# Patient Record
Sex: Female | Born: 1980 | Race: White | Hispanic: No | Marital: Married | Smoking: Never smoker
Health system: Southern US, Community
[De-identification: ages and names within clinical notes are randomized; demographics above are authoritative.]

## PROBLEM LIST (undated history)

## (undated) DIAGNOSIS — Z789 Other specified health status: Secondary | ICD-10-CM

## (undated) HISTORY — PX: BLADDER SURGERY: SHX569

## (undated) HISTORY — PX: DILATION AND CURETTAGE OF UTERUS: SHX78

---

## 2016-12-05 ENCOUNTER — Other Ambulatory Visit (HOSPITAL_COMMUNITY): Payer: Self-pay | Admitting: Nurse Practitioner

## 2016-12-05 DIAGNOSIS — O09522 Supervision of elderly multigravida, second trimester: Secondary | ICD-10-CM

## 2016-12-05 DIAGNOSIS — Z3689 Encounter for other specified antenatal screening: Secondary | ICD-10-CM

## 2016-12-05 DIAGNOSIS — O280 Abnormal hematological finding on antenatal screening of mother: Secondary | ICD-10-CM

## 2016-12-20 ENCOUNTER — Encounter (HOSPITAL_COMMUNITY): Payer: Self-pay | Admitting: *Deleted

## 2016-12-24 ENCOUNTER — Encounter (HOSPITAL_COMMUNITY): Payer: Self-pay

## 2016-12-24 ENCOUNTER — Ambulatory Visit (HOSPITAL_COMMUNITY)
Admission: RE | Admit: 2016-12-24 | Discharge: 2016-12-24 | Disposition: A | Discharge status: Home / Self Care | Payer: Medicaid Other | Source: Ambulatory Visit | Attending: Nurse Practitioner | Admitting: Nurse Practitioner

## 2016-12-24 DIAGNOSIS — Z3A19 19 weeks gestation of pregnancy: Secondary | ICD-10-CM | POA: Insufficient documentation

## 2016-12-24 DIAGNOSIS — O280 Abnormal hematological finding on antenatal screening of mother: Secondary | ICD-10-CM

## 2016-12-24 DIAGNOSIS — O09529 Supervision of elderly multigravida, unspecified trimester: Secondary | ICD-10-CM

## 2016-12-24 DIAGNOSIS — O09522 Supervision of elderly multigravida, second trimester: Secondary | ICD-10-CM | POA: Insufficient documentation

## 2016-12-24 DIAGNOSIS — Z3689 Encounter for other specified antenatal screening: Secondary | ICD-10-CM

## 2016-12-24 DIAGNOSIS — O34219 Maternal care for unspecified type scar from previous cesarean delivery: Secondary | ICD-10-CM | POA: Diagnosis not present

## 2016-12-24 DIAGNOSIS — O365990 Maternal care for other known or suspected poor fetal growth, unspecified trimester, not applicable or unspecified: Secondary | ICD-10-CM

## 2016-12-24 HISTORY — DX: Other specified health status: Z78.9

## 2016-12-25 ENCOUNTER — Other Ambulatory Visit (HOSPITAL_COMMUNITY): Payer: Self-pay | Admitting: *Deleted

## 2016-12-25 DIAGNOSIS — O365920 Maternal care for other known or suspected poor fetal growth, second trimester, not applicable or unspecified: Secondary | ICD-10-CM

## 2016-12-26 DIAGNOSIS — O365990 Maternal care for other known or suspected poor fetal growth, unspecified trimester, not applicable or unspecified: Secondary | ICD-10-CM | POA: Insufficient documentation

## 2016-12-26 DIAGNOSIS — O09529 Supervision of elderly multigravida, unspecified trimester: Secondary | ICD-10-CM | POA: Insufficient documentation

## 2016-12-26 DIAGNOSIS — O280 Abnormal hematological finding on antenatal screening of mother: Secondary | ICD-10-CM | POA: Insufficient documentation

## 2016-12-26 NOTE — Progress Notes (Signed)
Genetic Counseling  High-Risk Gestation Note  Appointment Date:  12/26/2016 Referred By: Orbie HurstErskine, Kelly B, NP Date of Birth:  05/28/1981 Partner:  Kathy Ross   Pregnancy History: [id number]: [id number] Estimated Date of Delivery: 05/20/17 Estimated Gestational Age: 1470w0d Attending: Charlsie MerlesMark Newman, MD   Ms. Kathy SimasWendy Ross and her partner, Mr. Kathy Ross, were seen for consultation for genetic counseling because of an elevated MSAFP of 2.87 MoMs based on maternal serum screening and fetal growth restriction on today's ultrasound.    In summary:  Reviewed ultrasound findings from today's exam  Fetal growth restriction, single umbilical artery  Discussed elevated MSAFP (2.87 MoM)  Reviewed possible explanations for elevation  Reviewed advanced maternal age and associated risk for fetal aneuploidy  Reviewed results of previous screening - first trimester screen within normal limits (< 1 in 10,000 risk for Down syndrome and Trisomy 18)  Discussed additional options  Ultrasound- follow-up scheduled in 3 weeks  Fetal echocardiogram - being facilitated  NIPS for fetal aneuploidy- elected to pursue today ([facility])  Amniocentesis - declined  Expanded pan-ethnic carrier screening- elected to pursue today ([facility] 175 conditions)  Discussed associations with unexplained elevated MSAFP  Reviewed family history concerns  Discussed carrier screening options - patient elected to pursue today ([facility])  CF  SMA  Hemoglobinopathies  We reviewed [name]'s maternal serum screening result, the elevation of MSAFP, and the associated 1 in 147 risk for a fetal open neural tube defect.   We reviewed open neural tube defects including: the typical multifactorial etiology and variable prognosis.  In addition, we discussed alternative explanations for an elevated MSAFP including: normal variation, twins, feto-maternal bleeding, a gestational dating error, abdominal wall defects, kidney  differences, oligohydramnios, and placental problems.  We discussed that an unexplained elevation of MSAFP is associated with an increased risk for third trimester complications including: prematurity, low birth weight, and pre-eclampsia.    Detailed ultrasound was performed today. This ultrasound revealed fetal measurements that are consistently small, consistent with intrauterine growth restriction (IUGR).  The long bones are all less than the 3%tile, without evidence of fractures or bowing.  The bone echogenicity is also normal.  The fetal head is also less than the 3%tile.  The pregnancy is dated by early ultrasound. Single umbilical artery is visualized today. Normal fetal heart structure was not able to be confirmed at the time of today's visit. Complete ultrasound results reported under separate cover.   [name] was counseled that there is considerable variability in the definition of what constitutes fetal growth restriction; however, the most commonly accepted definition is fetal/birth weight below the 10th percentile for gestational age.  We discussed that approximately 3-10% of all pregnancies have IUGR.  IUGR can result from a variety of causes, including chronic uteroplacental insufficiency, environmental exposures (drugs, alcohol, and other teratogens), congenital infections, or genetic etiologies.   Kathy SimasWendy Ross was counseled that SUA occurs in approximately 1 in every 200 pregnancies and is defined as the presence of one umbilical artery and one umbilical vein, instead of the typical three vessel cord containing two arteries and one vein.  We discussed that the finding of an isolated SUA (no other markers or anomalies visualized) is not thought to increase the risk for fetal aneuploidy. However, the literature is conflicting regarding an association between this finding and congenital heart defects.  In addition, we discussed that SUA is known to be associated with an increased risk  for fetal growth restriction.  We reviewed the options of serial  ultrasound to monitor fetal growth and a fetal echocardiogram.    A detailed medical history was obtained; [name] denies use of any illicit substances or known teratogens.  [name] does not have a history of specific maternal illnesses and denies known exposure to infections.  [name] reported no known infections during the pregnancy. Consider drawing maternal serum TORCH titers, if not previously performed. This screening was not performed at our office during today's visit.   We discussed that uteroplacental insufficiency or poor placentation is a common cause of IUGR.  When the growth restriction is due to nutritional compromise, the head growth tends to be spared, resulting in asymmetrical growth restriction.  Ultrasound showed that the fetal head circumference is also less than the 3rd percentile for gestational age.  However, [name]'s maternal serum AFP screen revealed an elevation of 2.87 MoM; given that AFP can be associated with placental problems, this screening finding increases the concern for a placental etiology.  Regarding genetic etiologies, we discussed the increased risk for specific chromosome conditions including fetal aneuploidy.  We reviewed chromosomes, nondisjunction, and the common features of trisomy 61 and triploidy.  In addition, we discussed the slight increase in risk for other chromosome aberrations including uniparental disomy (chromosomes 7 and 14), microdeletions, duplications, insertions, and translocations. They were counseled regarding maternal age and the association with risk for chromosome conditions due to nondisjunction with aging of the ova.   We reviewed chromosomes, nondisjunction, and the associated risk for fetal aneuploidy related to a maternal age of 36 y.o. at [redacted]w[redacted]d gestation.  We specifically discussed Down syndrome (trisomy 30), trisomies 31 and 98, and sex  chromosome aneuploidies (47,XXX and 47,XXY) including the common features and prognoses of each. Ms .Kathy Ross previously had first trimester screening, which reduced the risks for fetal Down syndrome (< 1 in 10,000) and trisomy 18 (< 1 in 10,000). We reviewed the methodology of this screen and detection rates. She understands that it is not diagnostic and does not screen for all chromosome conditions.     [name] was then counseled regarding the availability of amniocentesis including the associated risks, benefits, and limitations.  She understands that chromosome analysis can be performed both prenatally (amniotic fluid) and postnatally (peripheral blood or cord blood).  Additionally, we discussed the availability of microarray analysis, which can also be performed pre and postnatally.  She was counseled that microarray analysis is a molecular based technique in which a test sample of DNA (fetal) is compared to a reference (normal) genome in order to determine if the test sample has any extra or missing genetic information.  Microarray analysis allows for the detection of genetic deletions and duplications that are 100 times smaller than those identified by routine chromosome analysis.  We discussed that recent publications show that approximately 6% of patients with an abnormal fetal ultrasound and a normal fetal karyotype had a significant microdeletion/microduplication detected by prenatal microarray analysis.    We then discussed the option of noninvasive prenatal screening (NIPS)/prenatal cell free DNA testing.  We discussed that NIPS analyzes cell free fetal DNA found in the maternal circulation and provides a pregnancy specific risk assessment for specific chromosome conditions, including: trisomy 78, trisomy 18, trisomy 64, sex chromosome aneuploidy, and select microdeletion syndrome.  This test is not diagnostic for chromosome conditions; however, the reported detection rate is greater  than 99% for most conditions and the false positive rate is less than 0.1 for all of these  conditions. Specifically we discussed the option of [facility] through [employer]. This screen assesses cell free DNA from each chromosome in maternal blood and can detect deletions or duplications that are greater than or equal to , in addition to select microdeletion syndromes ( 22q11, 15q11, 11q23, 8q24, 5p15, 4p16, and 1p36). The sensitivity of this test for gains/losses of chromosome material greater than is reported to be >95%. We discussed limitations and benefits of this screen including that it is not diagnostic and cannot assess for all chromosome conditions nor assess for single gene conditions. We discussed the possible results of these tests may include positive, negative, or no results, and that NIPS also has the potential to identify underlying maternal health conditions related to chromosome information.  After thoughtful consideration of her options, Kathy Ross elected to pursue NIPS today ([facility]) and declined amniocentesis at this time. She may consider amniocentesis pending results of screening.     We then discussed other possible explanations for the above discussed ultrasound findings including single gene conditions.  Single gene conditions are typically tested for postnatally, based on the recommendation of a medical geneticist, unless ultrasound findings or the family history are strongly suggestive of a specific syndrome. Based on the specific ultrasound findings, there is an increase in risk for one of a group of conditions called skeletal dysplasias. Skeletal dysplasias vary in severity from mild to severe/lethal conditions.  We discussed the importance of follow up ultrasounds as some of the features of specific skeletal dysplasias evolve over time.   We reviewed the availability of a postnatal consult with a medical geneticist to help determine whether or not there  is a specific genetic etiology for the described differences, if warranted.   She was also counseled that IUGR can be a feature in a variety of other genetic conditions. We discussed the option of carrier screening for a select panel of autosomal recessive and X-linked conditions via expanded pan-ethnic carrier screening panel. We reviewed that ACOG currently recommends that all patients be offered carrier screening for cystic fibrosis, spinal muscular atrophy and hemoglobinopathies. In addition, they were counseled that there are a variety of genetic screening laboratories that have pan-ethnic, or expanded, carrier screening panels, which evaluate carrier status for a wide range of genetic conditions. Some of these conditions are severe and actionable, but also rare; others occur more commonly, but are less severe. Some of these conditions may have growth restriction as a feature, but many of the conditions on the panel are unrelated We discussed that testing options range from screening for a single condition to panels of more than 200 autosomal or X-linked genetic conditions. We reviewed that the prevalence of each condition varies (and often varies with ethnicity). The detection rate varies with each condition and also varies in some cases with ethnicity, ranging from greater than 99% (in the case of hemoglobinopathies) to unknown. We reviewed that a negative carrier screen would thus reduce, but not eliminate the chance to be a carrier for these conditions. For some conditions included on specific pan-ethnic carrier screening panels, the pre-test carrier frequency and/or the detection rate is unknown. We reviewed that in the event that one partner is found to be a carrier for one or more conditions, carrier screening would be available to the partner for those conditions. We discussed the risks, benefits, and limitations of carrier screening with the couple. After thoughtful consideration of their options,  Kathy Ross elected to have the universal carrier screening panel through [facility].  This panel screens for carrier status of 175 hereditary disorders, including the ACOG recommended conditions. We discussed that results will be available in approximately 2 weeks.   We also discussed that can also be a normal variant of growth or the result of familial short stature.  We discussed that the prognosis and postnatal management depend on the underlying etiology of the IUGR.  Both family histories were reviewed and found to be contributory for the patient's nephew with Autism spectrum disorder/Asperger syndrome. We discussed that Autistic spectrum disorders (ASD) are among the most common neurodevelopmental disorders, with approximately 1 in 68 children meeting criteria for ASD, according to the Centers for Disease Control. Approximately 80% of individuals diagnosed are female. There is strong evidence that genetic factors play a critical role in development of ASD. There have been recent advances in identifying specific genetic causes of ASD, however, there are still many individuals for whom the etiology of the ASD is not known. The majority of individuals with ASD (70-80%) have essential autism. There is strong evidence that genetic factors play a critical role in development of ASD. Some individuals with ASDs are found to have causative differences in karyotype analysis, chromosomal microarray analysis, or single genes. These are more likely to be identified in individuals with complex autism spectrum disorders.  The patient is aware that for many individuals with autism spectrum disorders an underlying genetic cause is not identified at this time. In the absence of an identified genetic etiology, prenatal screening or testing would not be available in the current pregnancy for the autism spectrum disorders in the family. [name] reported Caucasian ancestry, and Kathy Ross reported [place] ancestry.  Consanguinity for the couple was denied. Without further information regarding the provided family history, an accurate genetic risk cannot be calculated. Further genetic counseling is warranted if more information is obtained.   [name] denied exposure to environmental toxins or chemical agents. She denied the use of alcohol, tobacco or street drugs. She denied significant viral illnesses during the course of her pregnancy. Her medical and surgical histories were noncontributory.  I counseled [name] regarding the above risks and available options.  The approximate face-to-face time with the genetic counselor was 45 minutes.  Quinn Plowman, MS,  Certified Genetic Counselor 12/26/2016

## 2016-12-30 ENCOUNTER — Other Ambulatory Visit: Payer: Self-pay

## 2016-12-30 ENCOUNTER — Encounter (HOSPITAL_COMMUNITY): Payer: Self-pay

## 2017-01-03 ENCOUNTER — Telehealth (HOSPITAL_COMMUNITY): Payer: Self-pay | Admitting: MS"

## 2017-01-03 ENCOUNTER — Encounter (HOSPITAL_COMMUNITY): Payer: Self-pay | Admitting: MS"

## 2017-01-03 ENCOUNTER — Other Ambulatory Visit: Payer: Self-pay

## 2017-01-03 NOTE — Telephone Encounter (Signed)
Kathy Ross returned call. Discussed that I was calling patient to follow up regarding noninvasive prenatal screening (NIPS)/prenatal cell free DNA testing and expanded pan-ethnic carrier screening that was performed at the time of her visit on 12/24/16. Testing was performed given severe fetal growth restriction in the pregnancy. Patient identified by name and DOB. Per patient and OB report, the pregnancy resulted in fetal demise earlier this week. The patient reported that she is having a hard time. We discussed additional support resources including [facility] pregnancy and child loss support group.   [facility] was ordered through [employer], but unfortunately was not reportable given that data from the cell free DNA in the sample did not meet quality standards for interpretation. We discussed that per discussion with the lab, there are various reasons why data may not meet quality standards and be unable to be interrupted, including technical issues related to the tubes/shipping, or biological issues such as underlying maternal medical conditions, maternal medication use, or poor functioning/compromised placenta. We discussed that while we are unable to determine the exact cause in this case, given the fetal growth restriction and fetal demise, this may also be supportive of a poorly functioning placenta in the pregnancy. The patient expressed disappointment but understanding in not obtaining a result from this testing.   We also discussed her carrier screening results. Kathy Ross had carrier screening for 175 conditions, including the ACOG recommended conditions (SMA, CF, and hemoglobinopathies) through [facility]. The patient was identified by name and DOB. 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 We reviewed the autosomal recessive inheritance of this condition. Biotinidase deficiency is characterized by inability to process biotin due to enzymatic deficiency. Profound biotinidase deficiency is defined as less than 10% mean normal serum biotinidase activity, and partial biotinidase deficiency is characterized by 10-30% of normal serum biotinidase activity. If untreated, symptoms can include neurologic features, with partial biotinidase deficiency associated with less severe symptoms or symptoms only during times of stress.   We discussed that biotinidase deficiency is highly treatable, and management includes biotin supplementation. The treatment is lifelong and if started prior to symptoms, typically prevents symptoms. Biotinidase deficiency is included on the newborn screening panel in [place].   Prevalence is estimated to be 1 in 137,000 for profound biotinidase deficiency and 1 in 110,000 for partial biotinidase deficiency. Thus, prior to carrier screening for the patient's partner, risk for biotinidase deficiency in a pregnancy together for the couple is estimated to be 1 in 510. Approximately >99% of carriers will be identified by DNA sequencing of the BTD gene.   We discussed the option of carrier screening for her partner. There are different options available for carrier screening through [facility]. We discussed that he could be tested for only biotinidase deficiency to establish a reproductive risk for their offspring together. Alternatively, he could have carrier testing for the entire panel. As we already know she is screen negative for those other conditions on the panel, if he were to find out he were a carrier of a different condition the chance for that condition in an offspring would remain small. However, each offspring would have a 50% chance to be a carrier for any condition that either parent carriers. Thus, it may be  information that he would like to have in future, or  for his extended family. [name] gave permission [facility] to her email at [email or web address], and she plans to initiate carrier screening for her partner through the [facility].   We discussed that her results for the remaining 174 conditions are negative, indicating that she does not have a detectable gene alteration in any of the genes for which analysis was performed. We reviewed that carrier screening does not detect all carriers of these conditions, but a normal result significantly decreases the likelihood of being a carrier, and therefore, the overall reproductive risk. We reviewed that [facility] sequences most of the genes, which is associated with a high detection rate for carriers, thus a negative screen is very reassuring. All questions were answered to her satisfaction, she was encouraged to call with additional questions or concerns.  Clydie BraunKaren Serena Petterson  01/03/2017 9:53 AM

## 2017-01-03 NOTE — Telephone Encounter (Signed)
Attempted to call patient regarding results of NIPS and carrier screening. Patient's phone did not go to voicemail. No answer and unable to leave message.   Clydie BraunKaren Kemon Devincenzi  01/03/2017 9:13 AM

## 2017-01-10 ENCOUNTER — Ambulatory Visit (HOSPITAL_COMMUNITY): Payer: Medicaid Other

## 2017-01-10 ENCOUNTER — Encounter (HOSPITAL_COMMUNITY): Payer: Self-pay

## 2017-07-23 ENCOUNTER — Other Ambulatory Visit (HOSPITAL_COMMUNITY): Payer: Self-pay | Admitting: Unknown Physician Specialty

## 2017-07-23 DIAGNOSIS — Z363 Encounter for antenatal screening for malformations: Secondary | ICD-10-CM

## 2017-08-07 ENCOUNTER — Encounter (HOSPITAL_COMMUNITY): Payer: Self-pay | Admitting: Unknown Physician Specialty

## 2017-08-11 ENCOUNTER — Encounter (HOSPITAL_COMMUNITY): Payer: Self-pay | Admitting: *Deleted

## 2017-08-13 ENCOUNTER — Ambulatory Visit (HOSPITAL_COMMUNITY)
Admission: RE | Admit: 2017-08-13 | Discharge: 2017-08-13 | Disposition: A | Discharge status: Home / Self Care | Payer: Self-pay | Source: Ambulatory Visit | Attending: Unknown Physician Specialty | Admitting: Unknown Physician Specialty

## 2017-08-13 ENCOUNTER — Other Ambulatory Visit (HOSPITAL_COMMUNITY): Payer: Self-pay | Admitting: Unknown Physician Specialty

## 2017-08-13 ENCOUNTER — Encounter (HOSPITAL_COMMUNITY): Payer: Self-pay

## 2017-08-13 DIAGNOSIS — O09522 Supervision of elderly multigravida, second trimester: Secondary | ICD-10-CM | POA: Insufficient documentation

## 2017-08-13 DIAGNOSIS — Z3A19 19 weeks gestation of pregnancy: Secondary | ICD-10-CM

## 2017-08-13 DIAGNOSIS — O34219 Maternal care for unspecified type scar from previous cesarean delivery: Secondary | ICD-10-CM | POA: Insufficient documentation

## 2017-08-13 DIAGNOSIS — O289 Unspecified abnormal findings on antenatal screening of mother: Secondary | ICD-10-CM | POA: Insufficient documentation

## 2017-08-13 DIAGNOSIS — Z363 Encounter for antenatal screening for malformations: Secondary | ICD-10-CM

## 2017-08-13 DIAGNOSIS — Z3689 Encounter for other specified antenatal screening: Secondary | ICD-10-CM | POA: Insufficient documentation

## 2017-08-13 DIAGNOSIS — O09292 Supervision of pregnancy with other poor reproductive or obstetric history, second trimester: Secondary | ICD-10-CM | POA: Insufficient documentation

## 2017-08-13 DIAGNOSIS — O285 Abnormal chromosomal and genetic finding on antenatal screening of mother: Secondary | ICD-10-CM | POA: Insufficient documentation

## 2017-08-13 NOTE — Progress Notes (Signed)
DOB: 07/29/1981 Referring Provider: Ruthy DickMcLeod, William, MD Appointment Date:08/13/2017 Attending: Dr. Particia NearingMartha Decker  Ms. Kathy SimasWendy Ross was seen for genetic counseling regarding a maternal age of 37 y.o. and an increased risk for Trisomy 6118 based on a high risk Panorama screening result.  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   We reviewed chromosomes, nondisjunction, and the associated 1 in 488 risk for fetal aneuploidy related to a maternal age of 37 y.o. at 3066w0d gestation.  She was counseled that the risk for aneuploidy decreases as gestational age increases, accounting for those pregnancies which spontaneously abort.  We specifically discussed Down syndrome (trisomy 4021), trisomies 5613 and 6818, and sex chromosome aneuploidies (47,XXX and 47,XXY) including the common features and prognoses of each.   We also reviewed Kathy Ross's cell free DNA screening result and the associated increase in risk for fetal Trisomy 18. We reviewed that the cell free DNA screen cannot distinguish between aneuploidy confined to the placenta, trisomy 18, partial trisomy 18, or mosaic trisomy 4318. Additionally we discussed that the positive predictive value of a 9 in 10 chance for trisomy 18 is approximately 53% in a woman her age.  She understands that cell free DNA screening is not diagnostic and false positives and false  negatives do occur.  We discussed that trisomy 418 represents the second most common autosomal trisomy after Down syndrome and occurs in 1 in 3600-8500 livebirths.  Kathy Ross was counseled that trisomy 6318 results from meiotic nondisjunction involving the 18th pair of chromosomes in 94% of cases. Partial trisomy 18 occurs from an unbalanced translocation and represents <2% of cases; and mosaic trisomy 18 occurs in approximately 4-5% of cases.   She was then counseled that trisomy 10218 is characterized by prenatal onset of growth deficiency, neurodevelopmental delay that typically results in profound mental retardation in survivors, and significant organ system anomalies. Although any organ system can be involved, we specifically discussed that cardiac defects occur in 90% of children with trisomy 18; anomalies of the brain are present in nearly all (agenesis of the corpus callosum, small cerebellum, microgyria, and ventriculomegaly) individuals with trisomy 18; myelomeningocele occurs in 5% and approximately 2/3 of children with trisomy 18 have genitourinary anomalies, most commonly a horseshoe kidney. Additionally, omphaloceles are seen in ~30% of fetuses with trisomy 18 and limb deficiency defects (short or absent radii) occur in 5-10%. We also discussed that detailed ultrasound at 18+ weeks may identify other more subtle differences (markers) including choroid plexus cysts, rocker bottom feet, clenched hands, and strawberry shaped cranium. Although these later findings do not cause major medical problems, they are well described findings in fetuses with trisomy 4218. Kathy Ross had a targeted anatomy ultrasound today which did not identify any fetal anomalies or soft markers of aneuploidy.  She understands that this normal ultrasound can reduce, but not eliminate the chance for trisomy 18 in her pregnancy.  We discussed the value of follow up ultrasound to evaluate  the anatomy as the baby continues to  grow.  Kathy Ross was also counseled regarding diagnostic testing via amniocentesis.  We reviewed the approximate 1 in 300-500 risk for complications, including spontaneous pregnancy loss.  We discussed the possible results that the tests might provide including: positive, negative, unanticipated, and no result.  Diagnostic testing was declined today.  She understands that screening tests, including ultrasound and NIPS, cannot rule out all birth defects or genetic syndromes. The patient was advised of this limitation and states she still does not want diagnostic testing at this time.  She would like to proceed with follow up ultrasounds and will consider cord blood testing after birth.  Family histories were not reviewed at this time as Kathy Ross had genetic counseling in her prior pregnancy in May of 2018.  At that time, Kathy Ross was 37 years of age, had an elevated MSAFP and severe growth restriction.  That pregnancy resulted in a fetal demise at 20 weeks.  Cell free DNA screening was performed during that pregnancy, but a sufficient fetal sample was not obtained and she was unable to have a result at that time.  We discussed that without a specific etiology for that loss, a specific recurrence chance could not be determined.  She was reassured by the normal growth in her current pregnancy, but understands that we have not eliminated the chance for a similar growth concern in her current pregnancy and that we would like to continue to follow the pregnancy with ultrasound.  Without further information regarding the provided family history, an accurate genetic risk cannot be calculated. Further genetic counseling is warranted if more information is obtained.  At the time of her prior genetic counseling visit, she also underwent expanded carrier screening.  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 This particular variant has been associated with partial biotinidase deficiency. We reviewed the autosomal recessive inheritance of this condition. Biotinidase deficiency is characterized by inability to process biotin due to enzymatic deficiency. Profound biotinidase deficiency is defined as less than 10% mean normal serum biotinidase activity, and partial biotinidase deficiency is characterized by 10-30% of normal serum biotinidase activity. If untreated, symptoms can include neurologic features, with partial biotinidase deficiency associated with less severe symptoms or symptoms only during times of stress.   We discussed that biotinidase deficiency is highly treatable, and management includes biotin supplementation. The treatment is lifelong and if started prior to symptoms, typically prevents symptoms. We discussed that biotinidase deficiency is included on the newborn screening panel in [place].   Prevalence is estimated to be 1 in 137,000 for profound biotinidase deficiency and 1 in 110,000 for partial biotinidase deficiency. Thus, prior to carrier screening for the patient's partner, risk for biotinidase deficiency in the current pregnancy is estimated to be 1 in 640.  Ms. Ross was not interested in pursuing carrier screening for the father of the baby at this time.   Kathy Ross denied exposure to environmental toxins or chemical agents. She denied the use of alcohol, tobacco or street drugs. She denied significant viral illnesses during the course of her pregnancy. Her medical and surgical histories were noncontributory.     I counseled Kathy Ross regarding the above risks and available options.  The approximate face-to-face time with the genetic counselor was 30 minutes.    Mady Gemma, MS,  Certified Genetic Counselor

## 2017-08-15 ENCOUNTER — Encounter (HOSPITAL_COMMUNITY): Payer: Self-pay

## 2017-08-15 ENCOUNTER — Other Ambulatory Visit (HOSPITAL_COMMUNITY): Payer: Self-pay

## 2017-08-20 ENCOUNTER — Other Ambulatory Visit: Payer: Self-pay

## 2017-08-20 ENCOUNTER — Encounter (HOSPITAL_COMMUNITY): Payer: Self-pay

## 2018-04-01 ENCOUNTER — Encounter (HOSPITAL_COMMUNITY): Payer: Self-pay

## 2019-01-03 IMAGING — US US MFM OB DETAIL+14 WK
1 series · 14 of 28 positions shown · non-contrast
Comparison: none

[Series 1: us mfm ob detail+14 wk · 64 acquisitions, 14 frames shown]
[im 3/64]
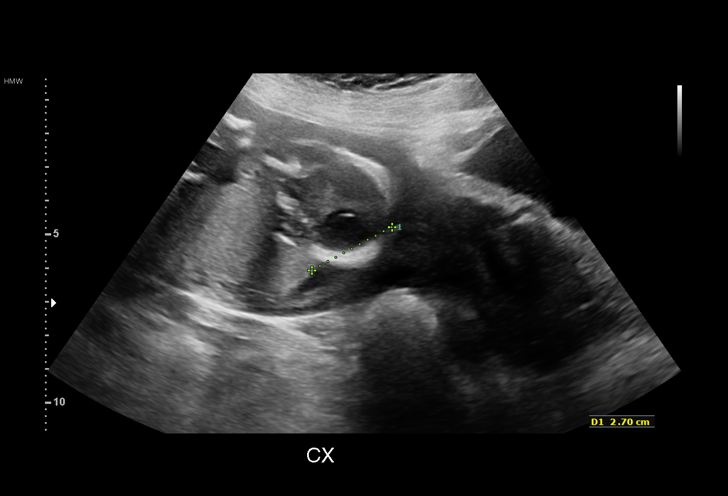
[im 8/64]
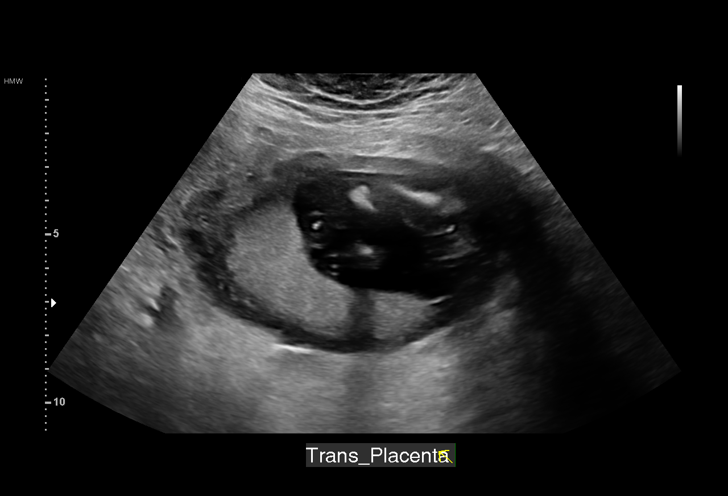
[im 12/64]
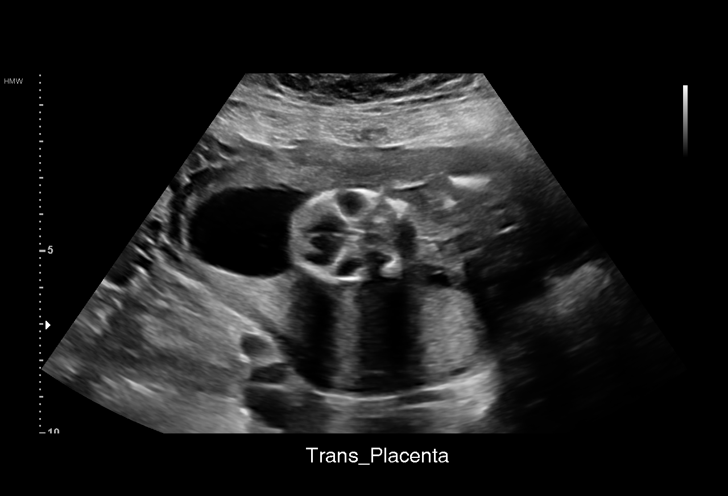
[im 17/64]
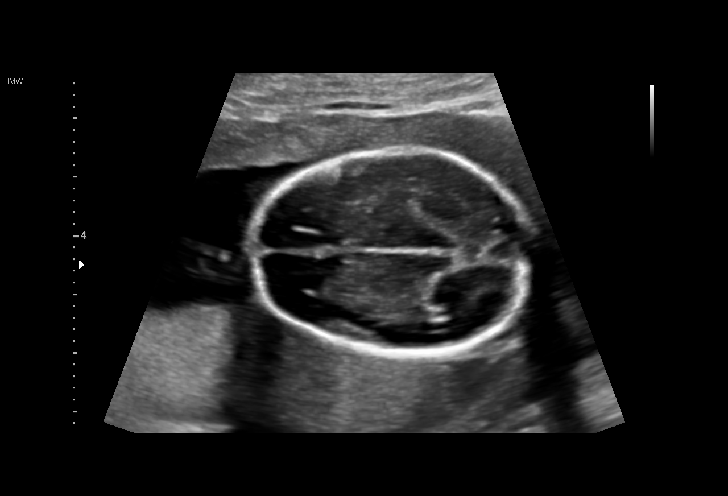
[im 22/64]
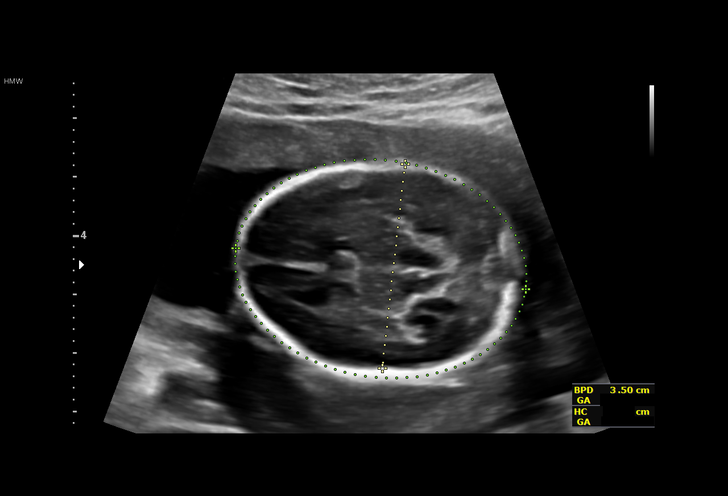
[im 26/64]
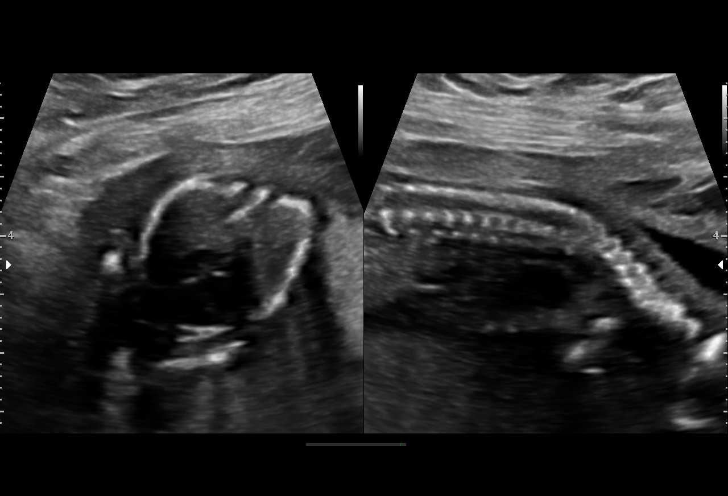
[im 31/64]
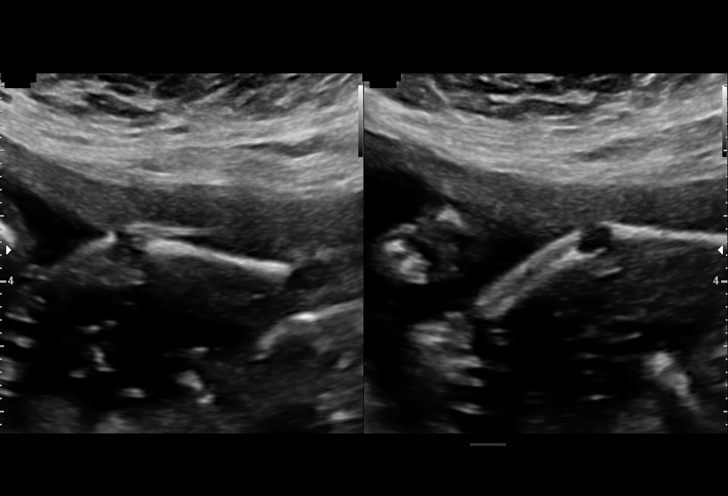
[im 36/64]
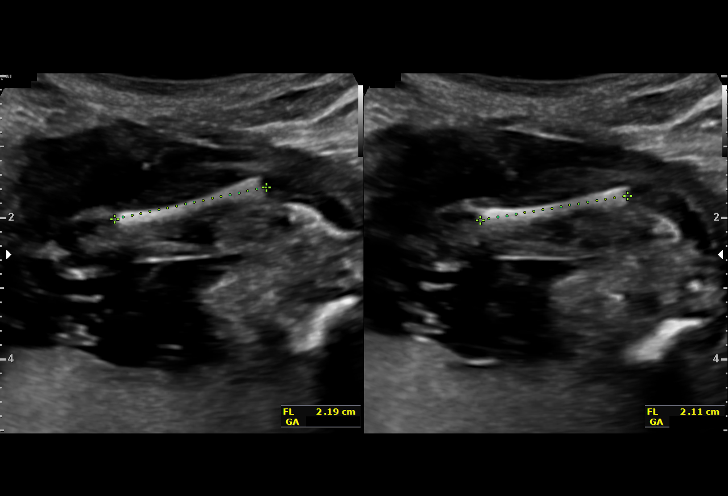
[im 40/64]
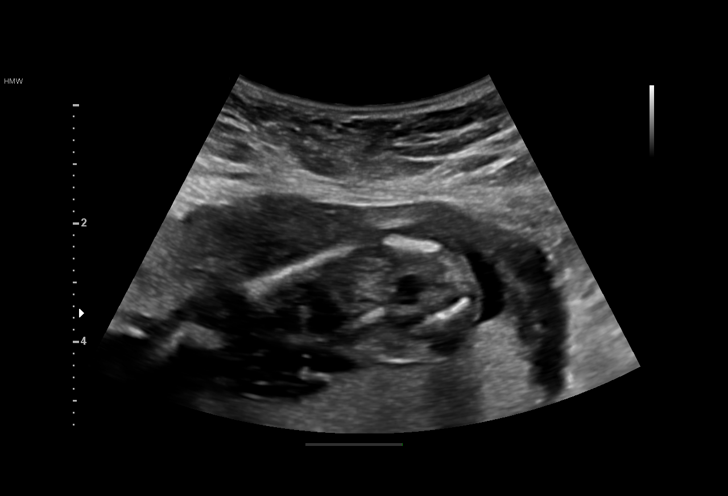
[im 45/64]
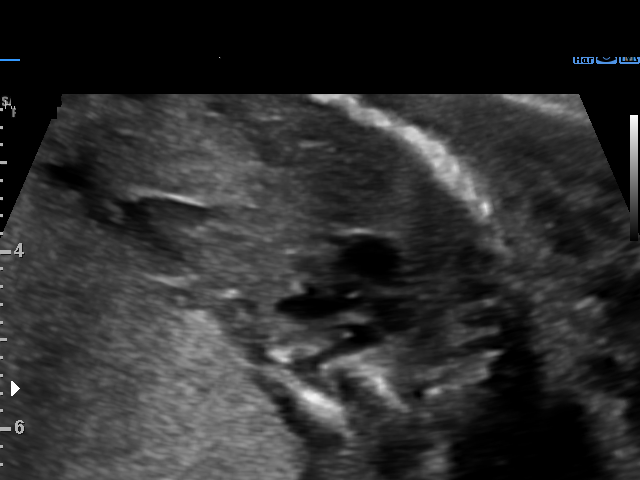
[im 50/64]
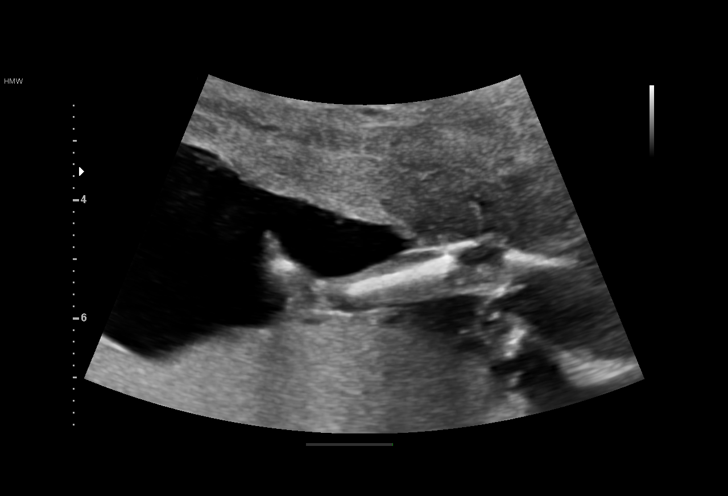
[im 54/64]
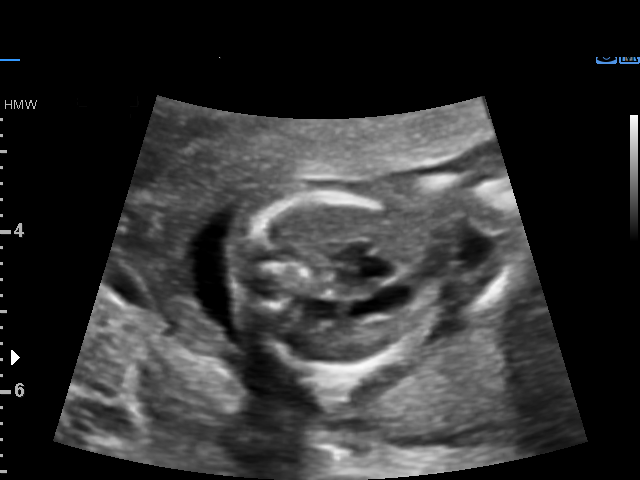
[im 59/64]
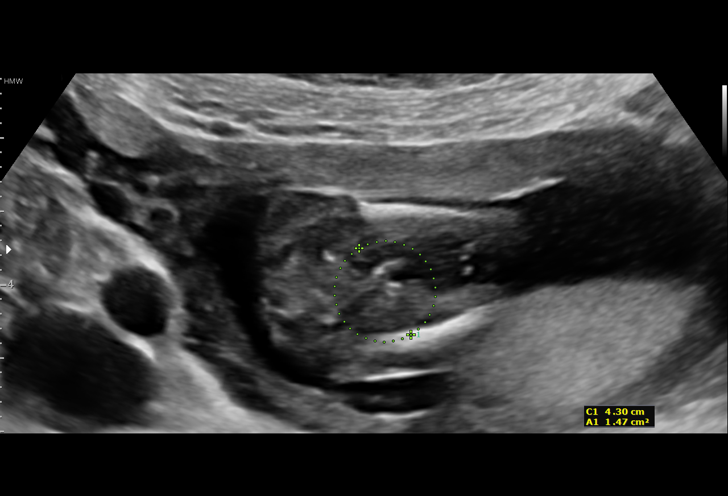
[im 64/64]
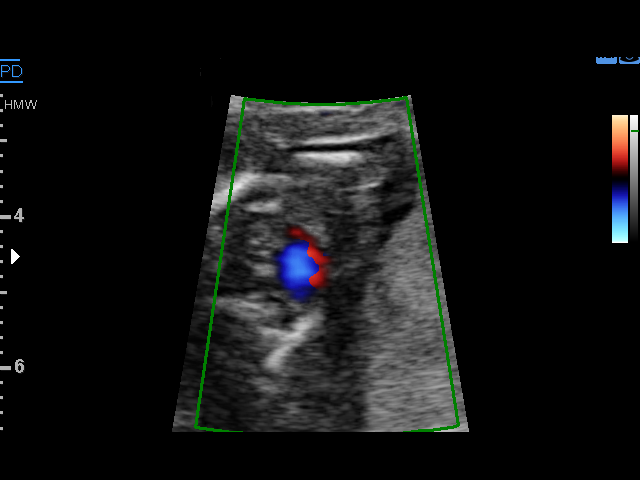

[14 of 28 positions shown; findings below may reference images not displayed]

Center
[REDACTED] [id number]

1  Ioksim Zalameda            [id number]      [id number]     [id number]
Indications

19 weeks gestation of pregnancy
Abnormal biochemical screen (quad) for
Trisomy 21 ([DATE])
Advanced maternal age multigravida 35+,
second trimester
Previous cesarean delivery, antepartum x 1
Encounter for antenatal screening for
malformations
OB History

Gravidity:    3         Term:   1         SAB:   1
Living:       1
Fetal Evaluation

Num Of Fetuses:     1
Fetal Heart         161
Rate(bpm):
Cardiac Activity:   Observed
Presentation:       Cephalic
Placenta:           Posterior, above cervical os
P. Cord Insertion:  Not well visualized

Amniotic Fluid
AFI FV:      Subjectively low-normal

Largest Pocket(cm)
2.0
Biometry

BPD:      34.6  mm     G. Age:  16w 5d          0  %    CI:        65.78   %    70 - 86
FL/HC:      15.7   %    16.1 -
HC:      136.9  mm     G. Age:  17w 1d        < 3  %    HC/AC:      1.36        1.09 -
AC:       101   mm     G. Age:  16w 1d        < 3  %    FL/BPD:     62.1   %
FL:       21.5  mm     G. Age:  16w 3d        < 3  %    FL/AC:      21.3   %    20 - 24
HUM:      21.8  mm     G. Age:  16w 4d        < 5  %

Est. FW:     154  gm      0 lb 5 oz   < 25  %
Gestational Age

LMP:           19w 0d        Date:  08/13/16                 EDD:   05/20/17
U/S Today:     16w 4d                                        EDD:   06/06/17
Best:          19w 0d     Det. By:  LMP  (08/13/16)          EDD:   05/20/17
Anatomy

Cranium:               Appears normal         Aortic Arch:            Not well visualized
Cavum:                 Appears normal         Ductal Arch:            Not well visualized
Ventricles:            Appears normal         Diaphragm:              Not well visualized
Choroid Plexus:        Appears normal         Stomach:                Appears normal, left
sided
Cerebellum:            Appears normal         Abdomen:                Echogenic Bowel
Posterior Fossa:       Appears normal         Abdominal Wall:         Not well visualized
Nuchal Fold:           Not well visualized    Cord Vessels:           2 vessel cord,
absent right SIRW OON
[place]
Face:                  Orbits and profile     Kidneys:                Not well visualized
previously seen
Lips:                  Appears normal         Bladder:                Appears normal
Thoracic:              Appears normal         Spine:                  Appears normal
Heart:                 Not well visualized    Upper Extremities:      Visualized
RVOT:                  Not well visualized    Lower Extremities:      Visualized
LVOT:                  Not well visualized

Other:  Technically difficult due to maternal habitus and fetal position.
Cervix Uterus Adnexa

Cervix
Length:            3.8  cm.
Normal appearance by transabdominal scan.

Uterus
No abnormality visualized.

Left Ovary
No adnexal mass visualized.

Right Ovary
No adnexal mass visualized.

Cul De Sac:   No free fluid seen.

Adnexa:       No abnormality visualized.
Impression

Singleton intrauterine pregnancy at 19+0 weeks with elevated
MSAFP
Review of the anatomy shows echodense bowel, but not
meeting criteria for pathologic echogenic bowel. There is a 2
vessel umbilical cord. In some views the outflow tracts
appear abnormal with an abnormal size differential, I am
unable to confirm a normal our chamber view.
Amniotic fluid volume is lwer limit of normal
Estimated fetal weight is g which is growth in the percentile
Biometry shows a 17 day growth lag
Recommendations

I discussed all the findings with the patient and her partner.
we will obtain a fetal echocardiogram ASAP to confirm
normal structure versus pathology.
She will see the genetic counselor today to assess for genetic
causes of early-onset growth lag
\We will repeat the scan in 3 weeks to assess interval growth

## 2019-06-11 ENCOUNTER — Other Ambulatory Visit: Payer: Self-pay

## 2019-06-11 DIAGNOSIS — Z20822 Contact with and (suspected) exposure to covid-19: Secondary | ICD-10-CM

## 2019-06-13 LAB — NOVEL CORONAVIRUS, NAA: SARS-CoV-2, NAA: DETECTED — AB
